# Patient Record
Sex: Male | Born: 2000 | Race: White | Hispanic: Yes | Marital: Single | Smoking: Never smoker
Health system: Southern US, Community
[De-identification: ages and names within clinical notes are randomized; demographics above are authoritative.]

## PROBLEM LIST (undated history)

## (undated) DIAGNOSIS — N62 Hypertrophy of breast: Secondary | ICD-10-CM

## (undated) DIAGNOSIS — E785 Hyperlipidemia, unspecified: Secondary | ICD-10-CM

## (undated) DIAGNOSIS — E559 Vitamin D deficiency, unspecified: Secondary | ICD-10-CM

## (undated) HISTORY — DX: Hyperlipidemia, unspecified: E78.5

## (undated) HISTORY — DX: Hypertrophy of breast: N62

## (undated) HISTORY — DX: Vitamin D deficiency, unspecified: E55.9

---
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
## 2022-03-27 ENCOUNTER — Ambulatory Visit: Payer: Managed Care, Other (non HMO) | Admitting: Internal Medicine

## 2022-03-31 ENCOUNTER — Telehealth: Payer: Self-pay | Admitting: Pediatrics

## 2022-03-31 NOTE — Telephone Encounter (Signed)
Patient's dad called stating his son had an appt with Dr. Larose Kells but unfortunately got the dates mixed up and came on the wrong day and the pt's appt had passed already. He would like to know if there is a chance his son could re-schedule his NP appointment. Is this ok?   Kandace Blitz [phone]

## 2022-04-03 NOTE — Telephone Encounter (Signed)
Yes, thank you.

## 2022-04-04 NOTE — Telephone Encounter (Signed)
LVM in spanish to call back and schedule appt.

## 2022-05-15 ENCOUNTER — Ambulatory Visit: Payer: Managed Care, Other (non HMO) | Admitting: Nurse Practitioner

## 2022-05-15 ENCOUNTER — Encounter: Payer: Self-pay | Admitting: Nurse Practitioner

## 2022-05-15 VITALS — BP 113/61 | HR 68 | Temp 97.2°F | Ht 66.0 in | Wt 189.4 lb

## 2022-05-15 DIAGNOSIS — Z13220 Encounter for screening for lipoid disorders: Secondary | ICD-10-CM

## 2022-05-15 DIAGNOSIS — F8089 Other developmental disorders of speech and language: Secondary | ICD-10-CM

## 2022-05-15 DIAGNOSIS — Z0000 Encounter for general adult medical examination without abnormal findings: Secondary | ICD-10-CM

## 2022-05-15 DIAGNOSIS — N5089 Other specified disorders of the male genital organs: Secondary | ICD-10-CM | POA: Diagnosis not present

## 2022-05-15 DIAGNOSIS — F909 Attention-deficit hyperactivity disorder, unspecified type: Secondary | ICD-10-CM

## 2022-05-15 DIAGNOSIS — F89 Unspecified disorder of psychological development: Secondary | ICD-10-CM

## 2022-05-15 NOTE — Patient Instructions (Signed)
1. Lump in the testicle  - US Scrotum  2. Routine adult health maintenance  - CBC - Comprehensive metabolic panel - Lipid Panel - Thyroid Panel With TSH  3. Lipid screening  - Lipid Panel  Follow up:  Follow up in 1 year

## 2022-05-15 NOTE — Progress Notes (Signed)
  ID: Joseph Page, male    DOB: 17-Aug-2000, 22 y.o.   MRN: [id number]  Chief Complaint  Patient presents with   Establish Care    Referring provider: [facility]   HPI  22 year old male with history of childhood asthma, ADHD, social communication disorder, visual processing defect  Patient presents today with his mother to establish care.  He was previously a patient at [facility].  Patient is now needing an adult primary care provider.  Interpreter was used for visit today.  That he was [facility] through his pediatric office.  He was found to have a social communication disorder, ADHD, visual processing defect.  It was recommended that patient see psychiatry for medication management.  We will refer patient today.  Does need a physical today with complete blood work.  He has not been seen by his primary care in the past couple years due to loss of insurance.  Patient's mother also states that before they lost her insurance he was scheduled for a scrotal ultrasound due to a lump in his testicles.  Patient states that he is not having any pain but does still feel this lump.  We will reorder ultrasound for patient today.  Denies f/c/s, n/v/d, hemoptysis, PND, leg swelling Denies chest pain or edema      No Known Allergies  Immunization History  Administered Date(s) Administered   DTaP 03/08/2000, 04/18/2000, 06/20/2000, 04/01/2001, 04/26/2004   H1N1 01/21/2008   HIB (PRP-OMP) 03/08/2000, 04/18/2000, 06/20/2000, 12/31/2000   Hepatitis A, Ped/Adol-2 Dose 05/02/2005, 11/07/2005   Hepatitis B, PED/ADOLESCENT 10/22/99, 02/03/2000, 09/26/2000   IPV 03/08/2000, 04/18/2000, 06/20/2000, 04/26/2004   MMR 02/03/2000, 12/31/2000, 04/26/2004   Meningococcal B, OMV 02/04/2016, 08/10/2017   Meningococcal polysaccharide vaccine (MPSV4) 06/14/2012, 02/04/2016   PFIZER(Purple Top)SARS-COV-2 Vaccination  07/05/2019, 08/02/2019   Pneumococcal Conjugate-13 03/08/2000, 04/18/2000, 06/20/2000, 05/25/2001, 06/25/2001   Tdap 03/17/2011   Varicella 12/31/2000, 01/05/2005    Past Medical History:  Diagnosis Date   Gynecomastia    Hyperlipidemia    Vitamin D deficiency     Tobacco History: Social History   Tobacco Use  Smoking Status Never  Smokeless Tobacco Never   Counseling given: Not Answered   No outpatient encounter medications on file as of 05/15/2022.   No facility-administered encounter medications on file as of 05/15/2022.     Review of Systems  Review of Systems  Constitutional: Negative.   HENT: Negative.    Cardiovascular: Negative.   Gastrointestinal: Negative.   Allergic/Immunologic: Negative.   Neurological: Negative.   Psychiatric/Behavioral: Negative.         Physical Exam  BP 113/61   Pulse 68   Temp (!) 97.2 F (36.2 C)   Ht  (1.676 m)   Wt 189 lb 6.4 oz (85.9 kg)   SpO2 99%   BMI 30.57 kg/m   Wt Readings from Last 5 Encounters:  05/15/22 189 lb 6.4 oz (85.9 kg)  06/07/15 195 lb (88.5 kg) (98 %, Z= 2.01)*  03/09/15 191 lb 9.6 oz (86.9 kg) (98 %, Z= 2.01)*  01/04/15 193 lb 12.8 oz (87.9 kg) (98 %, Z= 2.10)*  12/03/14 186 lb (84.4 kg) (98 %, Z= 1.96)*   * Growth percentiles are based on CDC (Boys, 2-20 Years) data.     Physical Exam Vitals and nursing note reviewed.  Constitutional:      General: He is not in acute distress.    Appearance: He is well-developed.  Cardiovascular:     Rate and Rhythm: Normal rate and regular rhythm.  Pulmonary:     Effort: Pulmonary effort is normal.     Breath sounds: Normal breath sounds.  Skin:    General: Skin is warm and dry.  Neurological:     Mental Status: He is alert and oriented to person, place, and time.      Lab Results:  CBC No results found for: "WBC", "RBC", "HGB", "HCT", "PLT", "MCV", "MCH", "MCHC", "RDW", "LYMPHSABS", "MONOABS", "EOSABS", "BASOSABS"  BMET     Component Value Date/Time   NA 140 06/07/2015 1401   K 4.1 06/07/2015 1401   CL 100 06/07/2015 1401   CO2 28 06/07/2015 1401   GLUCOSE 70 06/07/2015 1401   BUN 16 06/07/2015 1401   CREATININE 0.76 06/07/2015 1401   CALCIUM 9.6 06/07/2015 1401      Assessment & Plan:   Lump in the testicle - US Scrotum  2. Routine adult health maintenance  - CBC - Comprehensive metabolic panel - Lipid Panel - Thyroid Panel With TSH  3. Lipid screening  - Lipid Panel  Follow up:  Follow up in 1 year     Ivonne Andrew, NP 05/15/2022

## 2022-05-15 NOTE — Assessment & Plan Note (Signed)
-   US Scrotum  2. Routine adult health maintenance  - CBC - Comprehensive metabolic panel - Lipid Panel - Thyroid Panel With TSH  3. Lipid screening  - Lipid Panel  Follow up:  Follow up in 1 year

## 2022-05-16 LAB — LIPID PANEL
Chol/HDL Ratio: 5.4 ratio — ABNORMAL HIGH (ref 0.0–5.0)
Cholesterol, Total: 179 mg/dL (ref 100–199)
HDL: 33 mg/dL — ABNORMAL LOW (ref >39)
LDL Chol Calc (NIH): 125 mg/dL — ABNORMAL HIGH (ref 0–99)
Triglycerides: 116 mg/dL (ref 0–149)
VLDL Cholesterol Cal: 21 mg/dL (ref 5–40)

## 2022-05-16 LAB — COMPREHENSIVE METABOLIC PANEL
ALT: 21 IU/L (ref 0–44)
AST: 17 IU/L (ref 0–40)
Albumin/Globulin Ratio: 1.6 (ref 1.2–2.2)
Albumin: 4.3 g/dL (ref 4.3–5.2)
Alkaline Phosphatase: 87 IU/L (ref 44–121)
BUN/Creatinine Ratio: 12 (ref 9–20)
BUN: 10 mg/dL (ref 6–20)
Bilirubin Total: 0.6 mg/dL (ref 0.0–1.2)
CO2: 21 mmol/L (ref 20–29)
Calcium: 9.5 mg/dL (ref 8.7–10.2)
Chloride: 103 mmol/L (ref 96–106)
Creatinine, Ser: 0.85 mg/dL (ref 0.76–1.27)
Globulin, Total: 2.7 g/dL (ref 1.5–4.5)
Glucose: 88 mg/dL (ref 70–99)
Potassium: 4.6 mmol/L (ref 3.5–5.2)
Sodium: 141 mmol/L (ref 134–144)
Total Protein: 7 g/dL (ref 6.0–8.5)
eGFR: 126 mL/min/{1.73_m2} (ref >59)

## 2022-05-16 LAB — THYROID PANEL WITH TSH
Free Thyroxine Index: 2 (ref 1.2–4.9)
T3 Uptake Ratio: 29 % (ref 24–39)
T4, Total: 6.8 ug/dL (ref 4.5–12.0)
TSH: 2.64 u[IU]/mL (ref 0.450–4.500)

## 2022-05-16 LAB — CBC
Hematocrit: 48.4 % (ref 37.5–51.0)
Hemoglobin: 16 g/dL (ref 13.0–17.7)
MCH: 28.5 pg (ref 26.6–33.0)
MCHC: 33.1 g/dL (ref 31.5–35.7)
MCV: 86 fL (ref 79–97)
Platelets: 306 10*3/uL (ref 150–450)
RBC: 5.61 x10E6/uL (ref 4.14–5.80)
RDW: 13.2 % (ref 11.6–15.4)
WBC: 5 10*3/uL (ref 3.4–10.8)

## 2022-05-19 ENCOUNTER — Ambulatory Visit (HOSPITAL_COMMUNITY)
Admission: RE | Admit: 2022-05-19 | Discharge: 2022-05-19 | Disposition: A | Discharge status: Home / Self Care | Payer: Managed Care, Other (non HMO) | Source: Ambulatory Visit | Attending: Nurse Practitioner

## 2022-05-19 DIAGNOSIS — N5089 Other specified disorders of the male genital organs: Secondary | ICD-10-CM | POA: Insufficient documentation

## 2022-05-22 NOTE — Progress Notes (Signed)
Called pt and inform results.Gh 

## 2023-05-16 ENCOUNTER — Ambulatory Visit: Payer: Self-pay | Admitting: Nurse Practitioner
